# Patient Record
Sex: Female | Born: 2005 | Race: White | Hispanic: No | Marital: Single | Smoking: Never smoker
Health system: Southern US, Community
[De-identification: ages and names within clinical notes are randomized; demographics above are authoritative.]

## PROBLEM LIST (undated history)

## (undated) DIAGNOSIS — G43909 Migraine, unspecified, not intractable, without status migrainosus: Secondary | ICD-10-CM

---
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
## 2024-03-22 DIAGNOSIS — L74510 Primary focal hyperhidrosis, axilla: Secondary | ICD-10-CM | POA: Diagnosis not present

## 2024-04-05 DIAGNOSIS — K120 Recurrent oral aphthae: Secondary | ICD-10-CM | POA: Diagnosis not present

## 2024-04-05 DIAGNOSIS — J029 Acute pharyngitis, unspecified: Secondary | ICD-10-CM | POA: Diagnosis not present

## 2024-07-10 ENCOUNTER — Ambulatory Visit: Admitting: Urology
